# Patient Record
Sex: Male | Born: 1990 | Race: White | Hispanic: No | Marital: Single | Smoking: Current every day smoker
Health system: Southern US, Community
[De-identification: ages and names within clinical notes are randomized; demographics above are authoritative.]

---

## 2016-04-09 ENCOUNTER — Emergency Department (HOSPITAL_COMMUNITY)
Admission: EM | Admit: 2016-04-09 | Discharge: 2016-04-09 | Disposition: A | Discharge status: Home / Self Care | Payer: No Typology Code available for payment source | Attending: Emergency Medicine | Admitting: Emergency Medicine

## 2016-04-09 ENCOUNTER — Emergency Department (HOSPITAL_COMMUNITY): Payer: No Typology Code available for payment source

## 2016-04-09 ENCOUNTER — Encounter (HOSPITAL_COMMUNITY): Payer: Self-pay | Admitting: Emergency Medicine

## 2016-04-09 DIAGNOSIS — Z23 Encounter for immunization: Secondary | ICD-10-CM | POA: Diagnosis not present

## 2016-04-09 DIAGNOSIS — Y999 Unspecified external cause status: Secondary | ICD-10-CM | POA: Diagnosis not present

## 2016-04-09 DIAGNOSIS — S0990XA Unspecified injury of head, initial encounter: Secondary | ICD-10-CM

## 2016-04-09 DIAGNOSIS — F17200 Nicotine dependence, unspecified, uncomplicated: Secondary | ICD-10-CM | POA: Insufficient documentation

## 2016-04-09 DIAGNOSIS — M542 Cervicalgia: Secondary | ICD-10-CM | POA: Insufficient documentation

## 2016-04-09 DIAGNOSIS — Y92410 Unspecified street and highway as the place of occurrence of the external cause: Secondary | ICD-10-CM | POA: Insufficient documentation

## 2016-04-09 DIAGNOSIS — S0101XA Laceration without foreign body of scalp, initial encounter: Secondary | ICD-10-CM

## 2016-04-09 DIAGNOSIS — Y939 Activity, unspecified: Secondary | ICD-10-CM | POA: Diagnosis not present

## 2016-04-09 MED ORDER — TETANUS-DIPHTH-ACELL PERTUSSIS 5-2.5-18.5 LF-MCG/0.5 IM SUSP
0.5000 mL | Freq: Once | INTRAMUSCULAR | Status: AC
  Administered 2016-04-09: 0.5 mL via INTRAMUSCULAR
  Filled 2016-04-09: qty 0.5

## 2016-04-09 MED ORDER — NAPROXEN 500 MG PO TABS: 500.0000 mg | ORAL_TABLET | Freq: Two times a day (BID) | ORAL | Status: DC

## 2016-04-09 MED ORDER — HYDROCODONE-ACETAMINOPHEN 5-325 MG PO TABS
1.0000 | ORAL_TABLET | Freq: Once | ORAL | Status: AC
  Administered 2016-04-09: 1 via ORAL
  Filled 2016-04-09: qty 1

## 2016-04-09 MED ORDER — ONDANSETRON 4 MG PO TBDP
4.0000 mg | ORAL_TABLET | Freq: Once | ORAL | Status: AC
  Administered 2016-04-09: 4 mg via ORAL
  Filled 2016-04-09: qty 1
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
## 2016-04-09 NOTE — ED Notes (Signed)
Pt c/o sudden onset of sharp mid chest pain. MD notified and gave order to place pt on cardiac monitor. Pt placed on cardiac monitor, will continue to monitor.
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
## 2016-04-09 NOTE — ED Notes (Signed)
Pt laceration to top of head, left side has been cleansed with SAF- Clens. Laceration in crecsent shaped and approx. 1 inch in length. Pt tolerated well.

## 2016-04-20 ENCOUNTER — Emergency Department (HOSPITAL_COMMUNITY)
Admission: EM | Admit: 2016-04-20 | Discharge: 2016-04-20 | Disposition: A | Discharge status: Home / Self Care | Payer: No Typology Code available for payment source | Attending: Emergency Medicine | Admitting: Emergency Medicine

## 2016-04-20 ENCOUNTER — Encounter (HOSPITAL_COMMUNITY): Payer: Self-pay | Admitting: Emergency Medicine

## 2016-04-20 DIAGNOSIS — Z4802 Encounter for removal of sutures: Secondary | ICD-10-CM | POA: Diagnosis present

## 2016-04-20 DIAGNOSIS — F17200 Nicotine dependence, unspecified, uncomplicated: Secondary | ICD-10-CM | POA: Diagnosis not present

## 2016-04-20 NOTE — ED Notes (Signed)
Have staple (2) placed about 11 days ago.  Here for removal

## 2016-04-20 NOTE — Discharge Instructions (Signed)
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
## 2018-01-06 ENCOUNTER — Emergency Department (HOSPITAL_COMMUNITY)
Admission: EM | Admit: 2018-01-06 | Discharge: 2018-01-06 | Disposition: A | Discharge status: Home / Self Care | Payer: Self-pay | Attending: Emergency Medicine | Admitting: Emergency Medicine

## 2018-01-06 ENCOUNTER — Encounter (HOSPITAL_COMMUNITY): Payer: Self-pay | Admitting: Emergency Medicine

## 2018-01-06 ENCOUNTER — Other Ambulatory Visit: Payer: Self-pay

## 2018-01-06 DIAGNOSIS — Z79899 Other long term (current) drug therapy: Secondary | ICD-10-CM | POA: Insufficient documentation

## 2018-01-06 DIAGNOSIS — R197 Diarrhea, unspecified: Secondary | ICD-10-CM | POA: Insufficient documentation

## 2018-01-06 DIAGNOSIS — F17210 Nicotine dependence, cigarettes, uncomplicated: Secondary | ICD-10-CM | POA: Insufficient documentation

## 2018-01-06 DIAGNOSIS — R112 Nausea with vomiting, unspecified: Secondary | ICD-10-CM | POA: Insufficient documentation

## 2018-01-06 MED ORDER — PROMETHAZINE HCL 25 MG PO TABS: 25.0000 mg | ORAL_TABLET | Freq: Four times a day (QID) | ORAL | 0 refills | Status: DC | PRN

## 2018-01-06 NOTE — Discharge Instructions (Signed)
Small, frequent sips of clear fluids tonight, bland diet as tolerated starting tomorrow.  Follow-up with your primary doctor or return here for any worsening symptoms such as fever, persistent vomiting, and abdominal pain

## 2018-01-06 NOTE — ED Triage Notes (Signed)
Pt states he vomited x 1 at work and states they sent him home so he need work note to go back to work. Pt denies any pain or n/v/d at this time.

## 2018-01-07 NOTE — ED Provider Notes (Signed)
[facility] EMERGENCY DEPARTMENT Provider Note   CSN: [id number] Arrival date & time: 01/06/18  1845     History   Chief Complaint Chief Complaint  Patient presents with  . Emesis    HPI Jeffrey Davies is a 27 y.o. male.  HPI  Jeffrey Davies is a 27 y.o. male who presents to the Emergency Department requesting a return to work note.  States that he was at work earlier and had a sudden onset of nausea and vomited x 1.  States that his employer sent him home.  He denies abdominal pain, fever, diarrhea or further vomiting.    History reviewed. No pertinent past medical history.  There are no active problems to display for this patient.   History reviewed. No pertinent surgical history.     Home Medications    Prior to Admission medications   Medication Sig Start Date End Date Taking? Authorizing Provider  naproxen (NAPROSYN) 500 MG tablet Take 1 tablet (500 mg total) by mouth 2 (two) times daily. 04/09/16   Vanetta Mulders, MD  promethazine (PHENERGAN) 25 MG tablet Take 1 tablet (25 mg total) by mouth every 6 (six) hours as needed for nausea or vomiting. 01/06/18   Pauline Aus, PA-C    Family History No family history on file.  Social History Social History   Tobacco Use  . Smoking status: Current Every Day Smoker  . Smokeless tobacco: Never Used  Substance Use Topics  . Alcohol use: No  . Drug use: Not on file     Allergies   Patient has no known allergies.   Review of Systems Review of Systems  Constitutional: Negative for appetite change, chills and fever.  Respiratory: Negative for shortness of breath.   Cardiovascular: Negative for chest pain.  Gastrointestinal: Positive for nausea and vomiting. Negative for abdominal pain, blood in stool and diarrhea.  Genitourinary: Negative for decreased urine volume, difficulty urinating, dysuria and flank pain.  Musculoskeletal: Negative for back pain.  Skin: Negative for color change and rash.    Neurological: Negative for dizziness, weakness and numbness.  Hematological: Negative for adenopathy.  All other systems reviewed and are negative.    Physical Exam Updated Vital Signs BP 123/64   Pulse (!) 55   Temp 98 F (36.7 C)   Resp 17   Ht 6' (1.829 m)   Wt 80.7 kg (178 lb)   SpO2 97%   BMI 24.14 kg/m   Physical Exam  Constitutional: He is oriented to person, place, and time. He appears well-developed and well-nourished. No distress.  HENT:  Head: Normocephalic and atraumatic.  Mouth/Throat: Oropharynx is clear and moist.  Neck: Normal range of motion. Neck supple.  Cardiovascular: Normal rate, regular rhythm and normal heart sounds.  No murmur heard. Pulmonary/Chest: Effort normal and breath sounds normal. No respiratory distress.  Abdominal: Soft. He exhibits no distension and no mass. There is no tenderness. There is no rebound and no guarding.  Musculoskeletal: Normal range of motion. He exhibits no edema.  Lymphadenopathy:    He has no cervical adenopathy.  Neurological: He is alert and oriented to person, place, and time. He exhibits normal muscle tone. Coordination normal.  Skin: Skin is warm and dry.  Psychiatric: He has a normal mood and affect.  Nursing note and vitals reviewed.    ED Treatments / Results  Labs (all labs ordered are listed, but only abnormal results are displayed) Labs Reviewed - No data to display  EKG  EKG Interpretation None  Radiology No results found.  Procedures Procedures (including critical care time)  Medications Ordered in ED Medications - No data to display   Initial Impression / Assessment and Plan / ED Course  I have reviewed the triage vital signs and the nursing notes.  Pertinent labs & imaging results that were available during my care of the patient were reviewed by me and considered in my medical decision making (see chart for details).     pt here requesting a return to work note.  He is  well appearing.  abd non-tender, single episode of vomiting several hours prior to arrival w/o further sx's.  Discussed possibility of early viral process, doubt surgical abdomen.  Return precautions discussed  Final Clinical Impressions(s) / ED Diagnoses   Final diagnoses:  Nausea vomiting and diarrhea    ED Discharge Orders        Ordered    promethazine (PHENERGAN) 25 MG tablet  Every 6 hours PRN     01/06/18 1935       Pauline Ausriplett, Keasia Dubose, Cordelia Poche-C 01/07/18 2054    Donnetta Hutchingook, Brian, MD 01/08/18 0000

## 2018-01-25 ENCOUNTER — Emergency Department (HOSPITAL_COMMUNITY)
Admission: EM | Admit: 2018-01-25 | Discharge: 2018-01-25 | Disposition: A | Discharge status: Home / Self Care | Payer: Self-pay | Attending: Emergency Medicine | Admitting: Emergency Medicine

## 2018-01-25 ENCOUNTER — Other Ambulatory Visit: Payer: Self-pay

## 2018-01-25 ENCOUNTER — Encounter (HOSPITAL_COMMUNITY): Payer: Self-pay | Admitting: Emergency Medicine

## 2018-01-25 ENCOUNTER — Emergency Department (HOSPITAL_COMMUNITY): Payer: Self-pay

## 2018-01-25 DIAGNOSIS — F17210 Nicotine dependence, cigarettes, uncomplicated: Secondary | ICD-10-CM | POA: Insufficient documentation

## 2018-01-25 DIAGNOSIS — R1110 Vomiting, unspecified: Secondary | ICD-10-CM | POA: Insufficient documentation

## 2018-01-25 DIAGNOSIS — J069 Acute upper respiratory infection, unspecified: Secondary | ICD-10-CM | POA: Insufficient documentation

## 2018-01-25 DIAGNOSIS — B9789 Other viral agents as the cause of diseases classified elsewhere: Secondary | ICD-10-CM

## 2018-01-25 MED ORDER — PSEUDOEPHEDRINE HCL 60 MG PO TABS
60.0000 mg | ORAL_TABLET | Freq: Once | ORAL | Status: AC
  Administered 2018-01-25: 60 mg via ORAL
  Filled 2018-01-25: qty 1

## 2018-01-25 MED ORDER — ONDANSETRON HCL 4 MG PO TABS
4.0000 mg | ORAL_TABLET | Freq: Once | ORAL | Status: AC
  Administered 2018-01-25: 4 mg via ORAL
  Filled 2018-01-25: qty 1

## 2018-01-25 MED ORDER — IBUPROFEN 800 MG PO TABS
800.0000 mg | ORAL_TABLET | Freq: Once | ORAL | Status: AC
  Administered 2018-01-25: 800 mg via ORAL
  Filled 2018-01-25: qty 1

## 2018-01-25 NOTE — ED Notes (Signed)
Patient transported to X-ray 

## 2018-01-25 NOTE — ED Provider Notes (Signed)
[facility] EMERGENCY DEPARTMENT Provider Note   CSN: [id number] Arrival date & time: 01/25/18  4098     History   Chief Complaint Chief Complaint  Patient presents with  . Cough    HPI Jeffrey Davies is a 27 y.o. male.  Is a 27 year old male who presents to the emergency department with a complaint of cough and congestion.  The patient states that he has been sick for over a week.  The patient states that this particularly got bad on last night when he started having vomiting.  The patient states that he ate [place] food for the first time and he is not sure if this is coming from his cough, or if it is coming from the food.  He is not had any vomiting this morning.  The patient does not recall any high fever, sweats, chills, and no hemoptysis reported.  He has had congestion, some body aches, and generally not feeling well.  He presents now for assistance with this issue.   The history is provided by the patient.  Cough  Associated symptoms include myalgias. Pertinent negatives include no chest pain, no shortness of breath and no wheezing.    History reviewed. No pertinent past medical history.  There are no active problems to display for this patient.   History reviewed. No pertinent surgical history.     Home Medications    Prior to Admission medications   Not on File    Family History History reviewed. No pertinent family history.  Social History Social History   Tobacco Use  . Smoking status: Current Every Day Smoker    Packs/day: 0.50  . Smokeless tobacco: Never Used  Substance Use Topics  . Alcohol use: No  . Drug use: Not on file     Allergies   Patient has no known allergies.   Review of Systems Review of Systems  Constitutional: Positive for activity change and appetite change.       All ROS Neg except as noted in HPI  HENT: Positive for congestion. Negative for nosebleeds.   Eyes: Negative for photophobia and discharge.  Respiratory:  Positive for cough. Negative for shortness of breath and wheezing.   Cardiovascular: Negative for chest pain and palpitations.  Gastrointestinal: Positive for vomiting. Negative for abdominal pain and blood in stool.  Genitourinary: Negative for dysuria, frequency and hematuria.  Musculoskeletal: Positive for myalgias. Negative for arthralgias, back pain and neck pain.  Skin: Negative.   Neurological: Negative for dizziness, seizures and speech difficulty.  Psychiatric/Behavioral: Negative for confusion and hallucinations.     Physical Exam Updated Vital Signs BP 121/67 (BP Location: Right Arm)   Pulse 67   Temp 97.9 F (36.6 C) (Oral)   Resp 16   SpO2 99%   Physical Exam  Constitutional: He is oriented to person, place, and time. He appears well-developed and well-nourished.  Non-toxic appearance.  HENT:  Head: Normocephalic.  Right Ear: Tympanic membrane and external ear normal.  Left Ear: Tympanic membrane and external ear normal.  Nasal congestion present.  Eyes: EOM and lids are normal. Pupils are equal, round, and reactive to light.  Neck: Normal range of motion. Neck supple. Carotid bruit is not present.  Cardiovascular: Normal rate, regular rhythm, normal heart sounds, intact distal pulses and normal pulses.  Pulmonary/Chest: No respiratory distress.  Few scattered rhonchi.  There is symmetrical rise and fall of the chest.  Patient speaks in complete sentences without problem.  Abdominal: Soft. Bowel sounds are normal.  There is no tenderness. There is no guarding.  Musculoskeletal: Normal range of motion.  Lymphadenopathy:       Head (right side): No submandibular adenopathy present.       Head (left side): No submandibular adenopathy present.    He has no cervical adenopathy.  Neurological: He is alert and oriented to person, place, and time. He has normal strength. No cranial nerve deficit or sensory deficit.  Skin: Skin is warm and dry.  Psychiatric: He has a normal  mood and affect. His speech is normal.  Nursing note and vitals reviewed.    ED Treatments / Results  Labs (all labs ordered are listed, but only abnormal results are displayed) Labs Reviewed - No data to display  EKG  EKG Interpretation None       Radiology Dg Chest 2 View  Result Date: 01/25/2018 CLINICAL DATA:  Cough. EXAM: CHEST - 2 VIEW COMPARISON:  None. FINDINGS: Haziness over the right heart border is likely due to a pectus deformity seen on the lateral view. The heart, hila, mediastinum, and pleura are otherwise normal. No pulmonary nodules or masses. No focal infiltrates. IMPRESSION: Pectus excavatum.  No focal infiltrate. Electronically Signed   By: Gerome Samavid  Williams III M.D   On: 01/25/2018 11:08    Procedures Procedures (including critical care time)  Medications Ordered in ED Medications  pseudoephedrine (SUDAFED) tablet 60 mg (60 mg Oral Given 01/25/18 1028)  ibuprofen (ADVIL,MOTRIN) tablet 800 mg (800 mg Oral Given 01/25/18 1028)  ondansetron (ZOFRAN) tablet 4 mg (4 mg Oral Given 01/25/18 1028)     Initial Impression / Assessment and Plan / ED Course  I have reviewed the triage vital signs and the nursing notes.  Pertinent labs & imaging results that were available during my care of the patient were reviewed by me and considered in my medical decision making (see chart for details).       Final Clinical Impressions(s) / ED Diagnoses MDM  Vital signs within normal limits.  Pulse oximetry is normal at 99%.  Chest x-ray shows pectus excavatum, otherwise no significant changes.  I suspect the patient has upper respiratory symptoms.  He has had no more vomiting.  Patient will be given a few tablets of Zofran in the event that this vomiting should recur.  I have asked the patient to use a decongestant medication to prevent so much of the mucus from stimulating cough.  I have asked patient to increase fluids.  Patient is in agreement with this plan.   Final diagnoses:    Viral URI with cough  Is  ED Discharge Orders    None       Ivery QualeBryant, Terrence Wishon, PA-C 01/25/18 1247    Samuel JesterMcManus, Kathleen, DO 01/26/18 [phone]

## 2018-01-25 NOTE — ED Triage Notes (Signed)
Pt reports has had URI for last several weeks. Pt reports intense "coughing" spells last night. Pt reports nausea and emesis since coughing last night. Pt denies abd pain, fever, diarrhea. nad noted. Pt reports intermittent production "of green mucous" with cough.

## 2018-01-25 NOTE — Discharge Instructions (Signed)
Your vital signs within normal limits.  Your oxygen level is 99% on room air.  Your examination shows an upper respiratory infection.  Your chest x-ray is normal.  Please use Zofran every 6 hours as needed for nausea/vomiting.  Please increase fluids.  Wash hands frequently.  Use the decongestant of your choice (Sudafed, Claritin-D, Afrin).  Please see your primary physician or return to the emergency department if not improving.

## 2018-06-13 IMAGING — DX DG CHEST 2V
2 series · 2 of 2 positions shown · non-contrast
Comparison: None.

CLINICAL DATA: Cough.

EXAM:
CHEST - 2 VIEW

[chest pa]
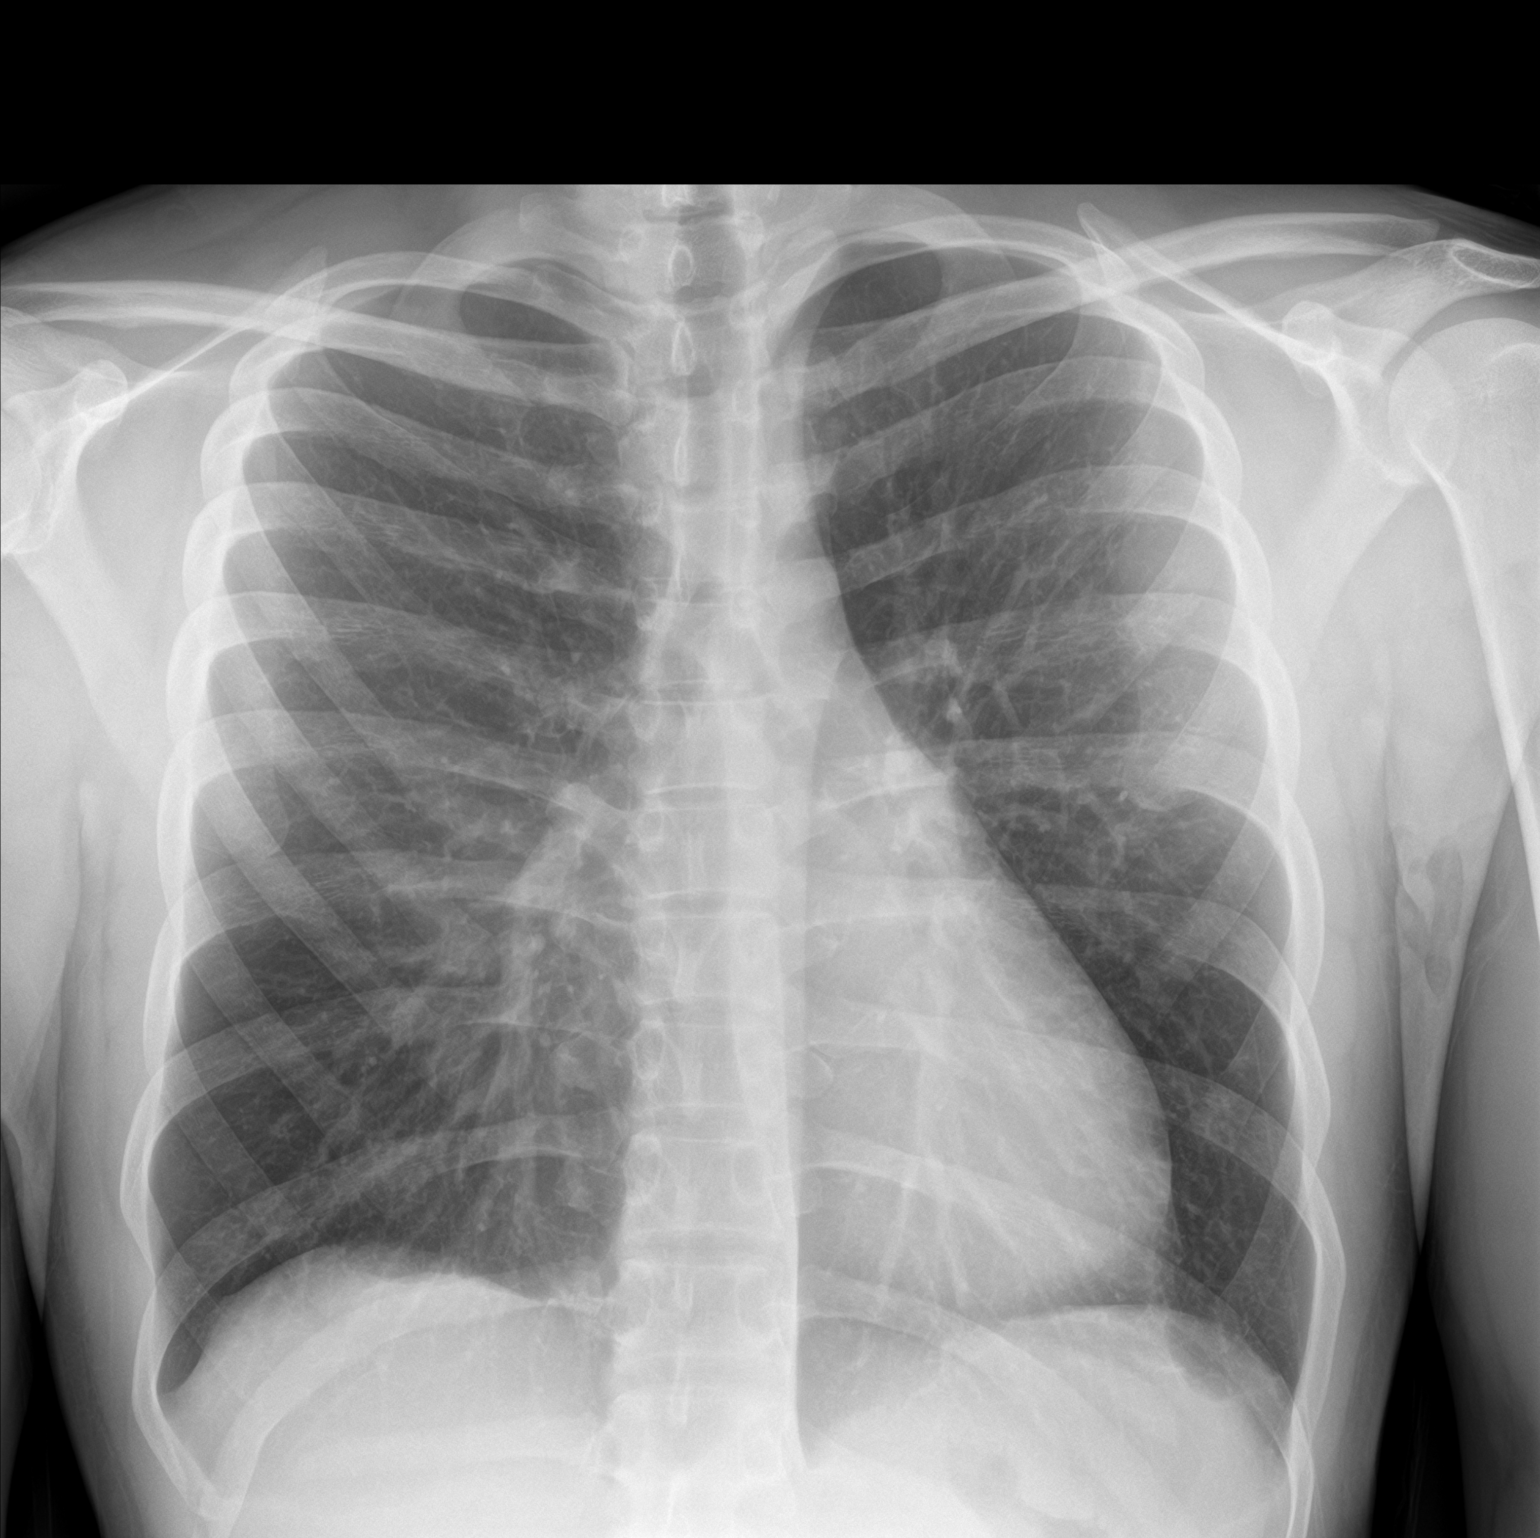

[chest lat]
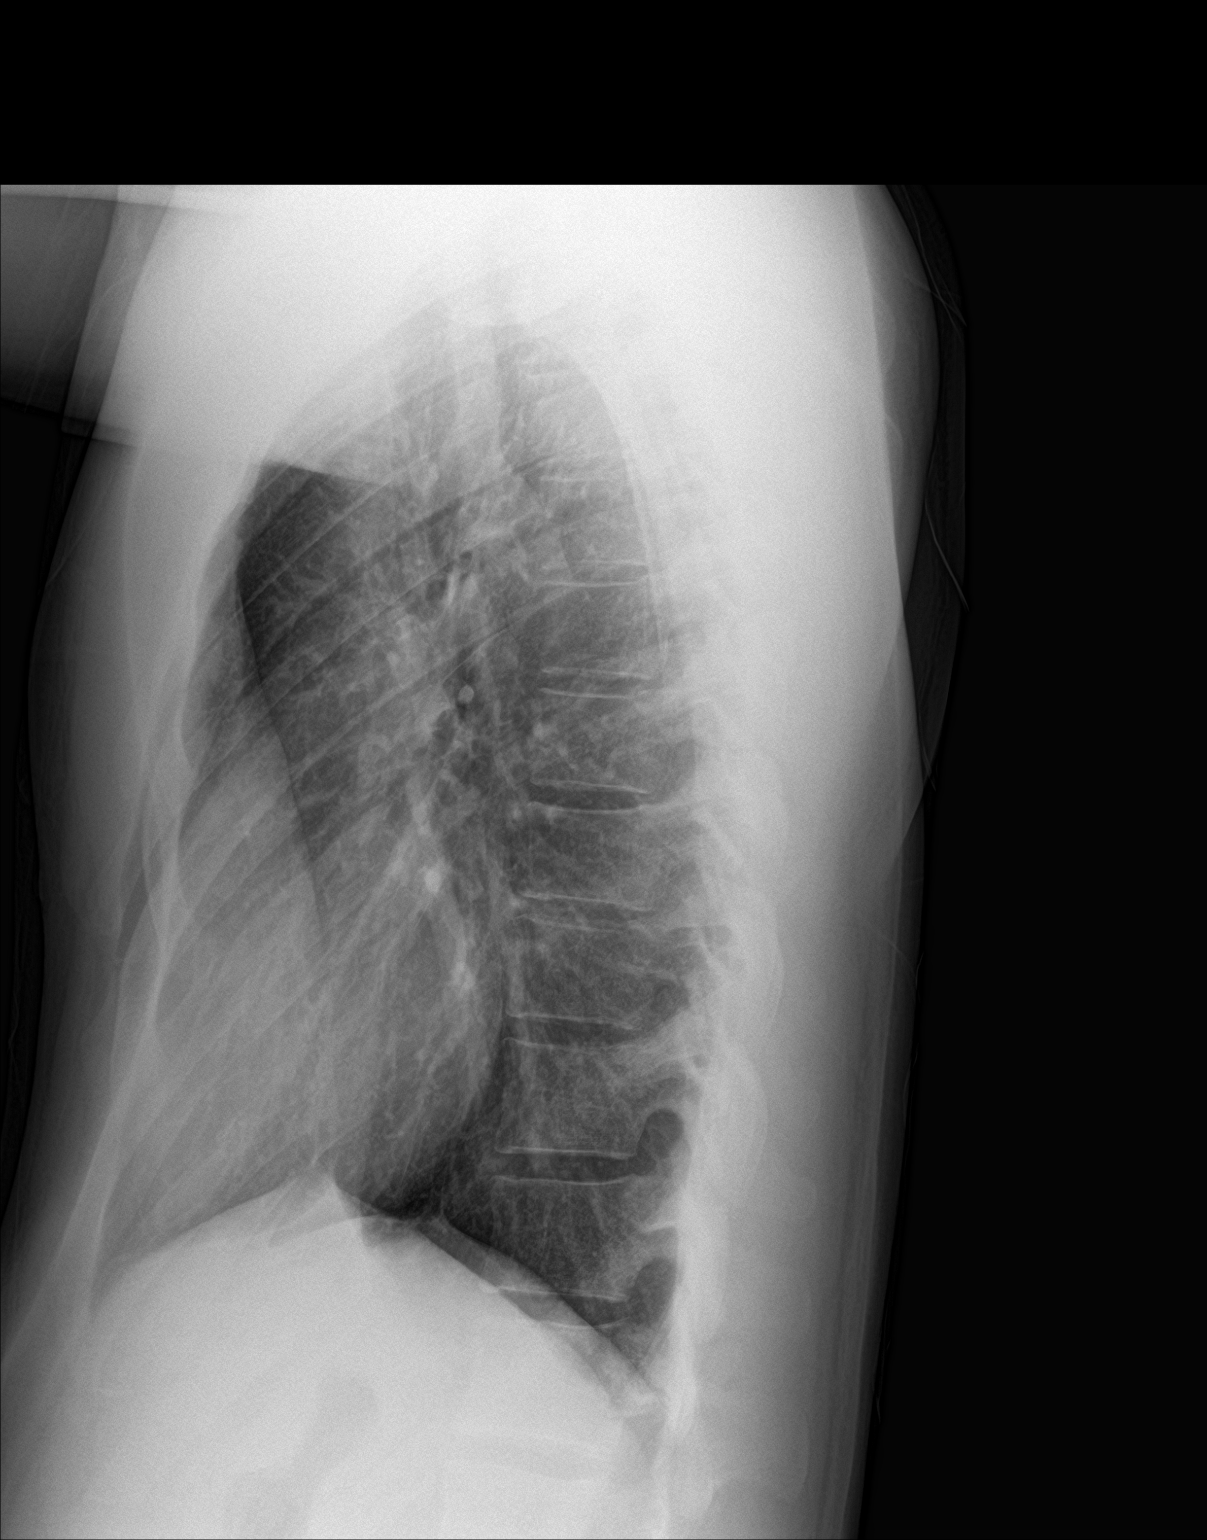

[2 of 2 positions shown; findings below may reference images not displayed]

FINDINGS: Haziness over the right heart border is likely due to a pectus
deformity seen on the lateral view. The heart, hila, mediastinum,
and pleura are otherwise normal. No pulmonary nodules or masses. No
focal infiltrates.
IMPRESSION: Pectus excavatum.  No focal infiltrate.

## 2021-05-24 ENCOUNTER — Other Ambulatory Visit: Payer: Self-pay | Admitting: Physician Assistant

## 2021-05-24 DIAGNOSIS — M25561 Pain in right knee: Secondary | ICD-10-CM

## 2021-05-24 DIAGNOSIS — S8391XA Sprain of unspecified site of right knee, initial encounter: Secondary | ICD-10-CM

## 2021-05-24 DIAGNOSIS — R222 Localized swelling, mass and lump, trunk: Secondary | ICD-10-CM
